# Patient Record
Sex: Male | Born: 1985 | Race: White | Hispanic: No | Marital: Single | Smoking: Current every day smoker
Health system: Southern US, Community
[De-identification: ages and names within clinical notes are randomized; demographics above are authoritative.]

## PROBLEM LIST (undated history)

## (undated) DIAGNOSIS — F419 Anxiety disorder, unspecified: Secondary | ICD-10-CM

## (undated) DIAGNOSIS — F32A Depression, unspecified: Secondary | ICD-10-CM

## (undated) DIAGNOSIS — F329 Major depressive disorder, single episode, unspecified: Secondary | ICD-10-CM

## (undated) HISTORY — PX: HERNIA REPAIR: SHX51

---

## 2014-03-05 ENCOUNTER — Emergency Department (INDEPENDENT_AMBULATORY_CARE_PROVIDER_SITE_OTHER)
Admission: EM | Admit: 2014-03-05 | Discharge: 2014-03-05 | Disposition: A | Discharge status: Home / Self Care | Payer: BC Managed Care – PPO | Source: Home / Self Care | Attending: Family Medicine | Admitting: Family Medicine

## 2014-03-05 ENCOUNTER — Encounter (HOSPITAL_COMMUNITY): Payer: Self-pay | Admitting: Emergency Medicine

## 2014-03-05 DIAGNOSIS — G44209 Tension-type headache, unspecified, not intractable: Secondary | ICD-10-CM

## 2014-03-05 HISTORY — DX: Major depressive disorder, single episode, unspecified: F32.9

## 2014-03-05 HISTORY — DX: Anxiety disorder, unspecified: F41.9

## 2014-03-05 HISTORY — DX: Depression, unspecified: F32.A

## 2014-03-05 MED ORDER — DICLOFENAC POTASSIUM(MIGRAINE) 50 MG PO PACK: 50.0000 mg | PACK | Freq: Two times a day (BID) | ORAL | Status: DC | PRN

## 2014-03-05 NOTE — ED Notes (Signed)
C/o much stress lately, headache, unable to get in to see a psychiatrist locally since moving down here for temp job 6 months ago

## 2014-03-05 NOTE — ED Provider Notes (Signed)
CSN: [id number]     Arrival date & time 03/05/14  1750 History   First MD Initiated Contact with Patient 03/05/14 1839     Chief Complaint  Patient presents with  . Headache   (Consider location/radiation/quality/duration/timing/severity/associated sxs/prior Treatment) HPI Comments: Patient presents with several week history of intermittent headaches he feels are triggered by stress. Has long standing issues with depression, insomnia and anxiety and has had initial visit with local psychiatrist (Dr. Archer AsaGerald Plovsky) [place]. Contacted Dr. Caprice RenshawPlovsky's office when he felt his stress levels increase, but was unable to find available appointment before March 23, 2014.  States headaches are becoming "a distraction" and are preventing his from doing some of the things he typically enjoys such as exercising at the gym or concentrating on his duties at work. 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 States he often becomes frustrated or angry at work and this will precipitate a headache.  Denies any additional symptoms associated with headache such as fever, recent head trauma, changes in speech, vision or balance. Denies nausea, vomiting. Reports he has been taking occasional OTC Advil with little relief. Denies drug or alcohol use. Denies excessive caffeine use. Reports he is currently trying to quit smoking Denies feelings of severe depression or  suicidal or homicidal ideation.    The history is provided by the patient.    Past Medical History  Diagnosis Date  . Anxiety   . Depression    History reviewed. No pertinent past surgical history. History reviewed. No pertinent family history. History  Substance Use Topics  . Smoking status: Current Every Day Smoker  . Smokeless tobacco: Not on file  . Alcohol Use: Yes    Review of Systems  Constitutional: Negative.   Eyes: Negative.   Respiratory: Negative.   Cardiovascular: Negative.    Gastrointestinal: Negative.   Endocrine: Negative for polydipsia, polyphagia and polyuria.  Genitourinary: Negative.   Skin: Negative.   Allergic/Immunologic: Negative for immunocompromised state.  Neurological: Positive for headaches. Negative for dizziness, tremors, seizures, syncope, facial asymmetry, speech difficulty, weakness, light-headedness and numbness.  Psychiatric/Behavioral: Positive for decreased concentration. Negative for suicidal ideas, hallucinations, behavioral problems, confusion, sleep disturbance, self-injury, dysphoric mood and agitation. The patient is nervous/anxious. The patient is not hyperactive.     Allergies  Review of patient's allergies indicates no known allergies.  Home Medications   Prior to Admission medications   Medication Sig Start Date End Date Taking? Authorizing Provider  carbamazepine (TEGRETOL) 200 MG tablet Take 200 mg by mouth 2 (two) times daily.   Yes Historical Provider, MD  lithium 300 MG tablet Take 300 mg by mouth 3 (three) times daily.   Yes Historical Provider, MD  LORazepam (ATIVAN) 1 MG tablet Take 1 mg by mouth every 8 (eight) hours.   Yes Historical Provider, MD  zolpidem (AMBIEN) 10 MG tablet Take 10 mg by mouth at bedtime as needed for sleep.   Yes Historical Provider, MD  Diclofenac Potassium 50 MG PACK Take 50 mg by mouth 2 (two) times daily as needed. Take at onset of headache. No more than 2 doses (100mg ) in 24 hours. 03/05/14   Jess BartersJennifer Lee Presson, PA   BP 137/78  Pulse 70  Temp(Src) 98.4 F (36.9 C) (Oral)  Resp 12  SpO2 98% Physical Exam  Nursing note and vitals reviewed. Constitutional: He is oriented to person, place, and time. Vital signs are normal. He appears well-developed and well-nourished. He is active  and cooperative.  Non-toxic appearance. He does not have a sickly appearance. He does not appear ill. No distress.  HENT:  Head: Normocephalic and atraumatic.  Right Ear: Hearing, tympanic membrane, external  ear and ear canal normal. No mastoid tenderness.  Left Ear: Hearing, tympanic membrane, external ear and ear canal normal. No mastoid tenderness.  Nose: Nose normal.  Mouth/Throat: Uvula is midline, oropharynx is clear and moist and mucous membranes are normal. No oral lesions. No trismus in the jaw. Normal dentition. No uvula swelling.  Eyes: Conjunctivae, EOM and lids are normal. Pupils are equal, round, and reactive to light.  Fundoscopic exam:      The right eye shows no papilledema.       The left eye shows no papilledema.  Neck: Trachea normal, normal range of motion, full passive range of motion without pain and phonation normal. Neck supple.  Cardiovascular: Normal rate, regular rhythm and normal heart sounds.   Pulmonary/Chest: Effort normal and breath sounds normal.  Abdominal: Soft. Normal appearance and bowel sounds are normal. There is no tenderness.  Musculoskeletal: Normal range of motion. He exhibits no edema and no tenderness.  Lymphadenopathy:    He has no cervical adenopathy.  Neurological: He is alert and oriented to person, place, and time. He has normal strength. No cranial nerve deficit or sensory deficit. Coordination and gait normal. GCS eye subscore is 4. GCS verbal subscore is 5. GCS motor subscore is 6.  Skin: Skin is warm and dry. No rash noted.  Psychiatric: He has a normal mood and affect. His speech is normal and behavior is normal. Judgment and thought content normal. Thought content is not paranoid. Cognition and memory are normal. He expresses no homicidal and no suicidal ideation. He expresses no suicidal plans and no homicidal plans.    ED Course  Procedures (including critical care time) Labs Review Labs Reviewed - No data to display  Imaging Review No results found.   MDM   1. Tension headache    Exam without focal finding to suggest intracranial abnormalities as cause for headaches. I asked patient specifically if he felt he needed to speak to  counselor or psychiatrist tonight and he stated he felt that he did not. He states that he feels that if he had something to help control headaches when they occur that he would be much more comfortable, less distracted and could return to activities he enjoys and would follow up with Dr. Donell BeersPlovsky in July. I informed him that is at any point he felt he needed immediate behavioral health assistance, he should report to his nearest ER. Will provide Rx for Cambia for headache treatment and advise follow up if no improvement.     Jess BartersJennifer Lee Nellis AF[place]resson, [place] 03/05/14 2120

## 2014-03-05 NOTE — Discharge Instructions (Signed)

## 2014-03-09 NOTE — ED Provider Notes (Signed)
Medical screening examination/treatment/procedure(s) were performed by a resident physician or non-physician practitioner and as the supervising physician I was immediately available for consultation/collaboration.  Evan Corey, MD    Evan S Corey, MD 03/09/14 0732 

## 2014-04-20 ENCOUNTER — Ambulatory Visit: Payer: Self-pay | Admitting: Medical

## 2014-05-03 ENCOUNTER — Other Ambulatory Visit (HOSPITAL_COMMUNITY): Payer: Self-pay | Admitting: Psychiatry

## 2014-05-07 ENCOUNTER — Other Ambulatory Visit (HOSPITAL_COMMUNITY): Payer: Self-pay | Admitting: Psychiatry

## 2014-07-18 ENCOUNTER — Telehealth: Payer: Self-pay

## 2014-07-18 ENCOUNTER — Ambulatory Visit (INDEPENDENT_AMBULATORY_CARE_PROVIDER_SITE_OTHER): Payer: BC Managed Care – PPO

## 2014-07-18 ENCOUNTER — Ambulatory Visit (INDEPENDENT_AMBULATORY_CARE_PROVIDER_SITE_OTHER): Payer: BC Managed Care – PPO | Admitting: Family Medicine

## 2014-07-18 ENCOUNTER — Ambulatory Visit (HOSPITAL_BASED_OUTPATIENT_CLINIC_OR_DEPARTMENT_OTHER)
Admission: RE | Admit: 2014-07-18 | Discharge: 2014-07-18 | Disposition: A | Discharge status: Home / Self Care | Payer: BC Managed Care – PPO | Source: Ambulatory Visit | Attending: Family Medicine | Admitting: Family Medicine

## 2014-07-18 ENCOUNTER — Encounter: Payer: Self-pay | Admitting: Family Medicine

## 2014-07-18 VITALS — BP 105/68 | HR 73 | Temp 98.6°F | Resp 12 | Ht 72.0 in | Wt 191.4 lb

## 2014-07-18 DIAGNOSIS — R410 Disorientation, unspecified: Secondary | ICD-10-CM

## 2014-07-18 DIAGNOSIS — S060X1A Concussion with loss of consciousness of 30 minutes or less, initial encounter: Secondary | ICD-10-CM

## 2014-07-18 DIAGNOSIS — R55 Syncope and collapse: Secondary | ICD-10-CM | POA: Insufficient documentation

## 2014-07-18 DIAGNOSIS — W19XXXA Unspecified fall, initial encounter: Secondary | ICD-10-CM | POA: Diagnosis not present

## 2014-07-18 DIAGNOSIS — R072 Precordial pain: Secondary | ICD-10-CM

## 2014-07-18 DIAGNOSIS — R42 Dizziness and giddiness: Secondary | ICD-10-CM | POA: Diagnosis present

## 2014-07-18 DIAGNOSIS — F05 Delirium due to known physiological condition: Secondary | ICD-10-CM

## 2014-07-18 DIAGNOSIS — R2689 Other abnormalities of gait and mobility: Secondary | ICD-10-CM

## 2014-07-18 LAB — POCT CBC
Granulocyte percent: 63.1 %G (ref 37–80)
HCT, POC: 43.3 % — AB (ref 43.5–53.7)
HEMOGLOBIN: 14.3 g/dL (ref 14.1–18.1)
Lymph, poc: 2.7 (ref 0.6–3.4)
MCH: 31.1 pg (ref 27–31.2)
MCHC: 33 g/dL (ref 31.8–35.4)
MCV: 94.3 fL (ref 80–97)
MID (cbc): 0.7 (ref 0–0.9)
MPV: 8.7 fL (ref 0–99.8)
POC Granulocyte: 5.7 (ref 2–6.9)
POC LYMPH PERCENT: 29.5 %L (ref 10–50)
POC MID %: 7.4 %M (ref 0–12)
Platelet Count, POC: 193 10*3/uL (ref 142–424)
RBC: 4.59 M/uL — AB (ref 4.69–6.13)
RDW, POC: 13 %
WBC: 9 10*3/uL (ref 4.6–10.2)

## 2014-07-18 LAB — COMPREHENSIVE METABOLIC PANEL
ALBUMIN: 4.3 g/dL (ref 3.5–5.2)
ALK PHOS: 123 U/L — AB (ref 39–117)
ALT: 24 U/L (ref 0–53)
AST: 19 U/L (ref 0–37)
BUN: 13 mg/dL (ref 6–23)
CHLORIDE: 105 meq/L (ref 96–112)
CO2: 27 meq/L (ref 19–32)
Calcium: 9.5 mg/dL (ref 8.4–10.5)
Creat: 0.82 mg/dL (ref 0.50–1.35)
GLUCOSE: 61 mg/dL — AB (ref 70–99)
Potassium: 4.5 mEq/L (ref 3.5–5.3)
Sodium: 139 mEq/L (ref 135–145)
TOTAL PROTEIN: 7.1 g/dL (ref 6.0–8.3)
Total Bilirubin: 0.4 mg/dL (ref 0.2–1.2)

## 2014-07-18 LAB — TSH: TSH: 0.924 u[IU]/mL (ref 0.350–4.500)

## 2014-07-18 LAB — POCT SEDIMENTATION RATE: POCT SED RATE: 2 mm/hr (ref 0–22)

## 2014-07-18 LAB — GLUCOSE, POCT (MANUAL RESULT ENTRY): POC Glucose: 59 mg/dl — AB (ref 70–99)

## 2014-07-18 NOTE — Telephone Encounter (Signed)
Lmom with normal CT head results.

## 2014-07-18 NOTE — Patient Instructions (Addendum)
Go to [facility] register at the Emergency Department for OUTPATIENT CT. DO NOT register as ED patient.   You need to have bed rest and brain rest until you are feeling back to normal.  You can listen to music, sleep, and read large print non-stressful things NOT on a screen for the next day.  When you are feeling better - hopefully by tomorrow - you can gradually increase your activity. You can take your normal medications but do not take any additional and try to stay away from any over the counter medications. When you are mentally feeling back to normal without a headache, you can resume reading and going to work. Try to stay away from screens in your spare time for that day. If you do well, with no recurrence of symptoms, you can resume phone/computer/tv in your spare time.  If you are tolerating that for a day, then you can resume working out.  If you ever have symptoms recur, resume the step prior where you were asymptomatic and stay at that level for 24 hours before trying to progress activity again.   Head Injury You have received a head injury. It does not appear serious at this time. Headaches and vomiting are common following head injury. It should be easy to awaken from sleeping. Sometimes it is necessary for you to stay in the emergency department for a while for observation. Sometimes admission to the hospital may be needed. After injuries such as yours, most problems occur within the first 24 hours, but side effects may occur up to 7-10 days after the injury. It is important for you to carefully monitor your condition and contact your health care provider or seek immediate medical care if there is a change in your condition. WHAT ARE THE TYPES OF HEAD INJURIES? Head injuries can be as minor as a bump. Some head injuries can be more severe. More severe head injuries include:  A jarring injury to the brain (concussion).  A bruise of the brain (contusion). This mean there is  bleeding in the brain that can cause swelling.  A cracked skull (skull fracture).  Bleeding in the brain that collects, clots, and forms a bump (hematoma). WHAT CAUSES A HEAD INJURY? A serious head injury is most likely to happen to someone who is in a car wreck and is not wearing a seat belt. Other causes of major head injuries include bicycle or motorcycle accidents, sports injuries, and falls. HOW ARE HEAD INJURIES DIAGNOSED? A complete history of the event leading to the injury and your current symptoms will be helpful in diagnosing head injuries. Many times, pictures of the brain, such as CT or MRI are needed to see the extent of the injury. Often, an overnight hospital stay is necessary for observation.  WHEN SHOULD I SEEK IMMEDIATE MEDICAL CARE?  You should get help right away if:  You have confusion or drowsiness.  You feel sick to your stomach (nauseous) or have continued, forceful vomiting.  You have dizziness or unsteadiness that is getting worse.  You have severe, continued headaches not relieved by medicine. Only take over-the-counter or prescription medicines for pain, fever, or discomfort as directed by your health care provider.  You do not have normal function of the arms or legs or are unable to walk.  You notice changes in the black spots in the center of the colored part of your eye (pupil).  You have a clear or bloody fluid coming from your nose  or ears.  You have a loss of vision. During the next 24 hours after the injury, you must stay with someone who can watch you for the warning signs. This person should contact local emergency services (911 in the [place] if you have seizures, you become unconscious, or you are unable to wake up. HOW CAN I PREVENT A HEAD INJURY IN THE FUTURE? The most important factor for preventing major head injuries is avoiding motor vehicle accidents. To minimize the potential for damage to your head, it is crucial to wear seat belts while  riding in motor vehicles. Wearing helmets while bike riding and playing collision sports (like football) is also helpful. Also, avoiding dangerous activities around the house will further help reduce your risk of head injury.  WHEN CAN I RETURN TO NORMAL ACTIVITIES AND ATHLETICS? You should be reevaluated by your health care provider before returning to these activities. If you have any of the following symptoms, you should not return to activities or contact sports until 1 week after the symptoms have stopped:  Persistent headache.  Dizziness or vertigo.  Poor attention and concentration.  Confusion.  Memory problems.  Nausea or vomiting.  Fatigue or tire easily.  Irritability.  Intolerant of bright lights or loud noises.  Anxiety or depression.  Disturbed sleep. MAKE SURE YOU:   Understand these instructions.  Will watch your condition.  Will get help right away if you are not doing well or get worse. Document Released: 08/27/2005 Document Revised: 09/01/2013 Document Reviewed: 05/04/2013 [facility] Patient Information 2015 [place], [place]. This information is not intended to replace advice given to you by your health care provider. Make sure you discuss any questions you have with your health care provider.  Concussion Direct trauma to the head often causes a condition known as a concussion. This injury can temporarily interfere with brain function and may cause you to pass out (lose consciousness). The consequences of a concussion are usually short-term, but repetitive concussions can be very dangerous. If you have multiple concussions, you will have a greater risk of long-term effects, such as slurred speech, slow movements, impaired thinking, or tremors. The severity of a concussion is based on the length and severity of the interference with brain activity. SYMPTOMS  Symptoms of a concussion vary depending on the severity of the injury. Very mild concussions may even occur  without any noticeable symptoms. Swelling in the area of the injury is not related to the seriousness of the injury.   Mild concussion:  Temporary loss of consciousness may or may not occur.  Memory loss (amnesia) for a short time.  Emotional instability.  Confusion.  Severe concussion:  Usually prolonged loss of consciousness.  Confusion  One pupil (the black part in the middle of the eye) is larger than the other.  Changes in vision (including blurring).  Changes in breathing.  Disturbed balance (equilibrium).  Headaches.  Confusion.  Nausea or vomiting.  Slower reaction time than normal.  Difficulty learning and remembering things you have heard. CAUSES  A concussion is the result of trauma to the head. When the head is subjected to such an injury, the brain strikes against the inner wall of the skull. This impact is what causes the damage to the brain. The force of injury is related to severity of injury. The most severe concussions are associated with incidents that involve large impact forces such as motor vehicle accidents. Wearing a helmet will reduce the severity of trauma to the head, but concussions  may still occur if you are wearing a helmet. RISK INCREASES WITH:  Contact sports (football, hockey, soccer, rugby, basketball or lacrosse).  Fighting sports (martial arts or boxing).  Riding bicycles, motorcycles, or horses (when you ride without a helmet). PREVENTION  Wear proper protective headgear and ensure correct fit.  Wear seat belts when driving and riding in a car.  Do not drink or use mind-altering drugs and drive. PROGNOSIS  Concussions are typically curable if they are recognized and treated early. If a severe concussion or multiple concussions go untreated, then the complications may be life-threatening or cause permanent disability and brain damage. RELATED COMPLICATIONS   Permanent brain damage (slurred speech, slow movement, impaired  thinking, or tremors).  Bleeding under the skull (subdural hemorrhage or hematoma, epidural hematoma).  Bleeding into the brain.  Prolonged healing time if usual activities are resumed too soon.  Infection if skin over the concussion site is broken.  Increased risk of future concussions (less trauma is required for a second concussion than the first). TREATMENT  Treatment initially requires immediate evaluation to determine the severity of the concussion. Occasionally, a hospital stay may be required for observation and treatment.  Avoid exertion. Bed rest for the first 24-48 hours is recommended.  Return to play is a controversial subject due to the increased risk for future injury as well as permanent disability and should be discussed at length with your treating caregiver. Many factors such as the severity of the concussion and whether this is the first, second, or third concussion play a role in timing a patient's return to sports.  MEDICATION  Do not give any medicine, including non-prescription acetaminophen or aspirin, until the diagnosis is certain. These medicines may mask developing symptoms.  SEEK IMMEDIATE MEDICAL CARE IF:   Symptoms get worse or do not improve in 24 hours.  Any of the following symptoms occur:  Vomiting.  The inability to move arms and legs equally well on both sides.  Fever.  Neck stiffness.  Pupils of unequal size, shape, or reactivity.  Convulsions.  Noticeable restlessness.  Severe headache that persists for longer than 4 hours after injury.  Confusion, disorientation, or mental status changes. Document Released: 08/27/2005 Document Revised: 06/17/2013 Document Reviewed: 12/09/2008 [facility] Patient Information 2015 [place], [place]. This information is not intended to replace advice given to you by your health care provider. Make sure you discuss any questions you have with your health care provider.  Concussion A concussion, or  closed-head injury, is a brain injury caused by a direct blow to the head or by a quick and sudden movement (jolt) of the head or neck. Concussions are usually not life-threatening. Even so, the effects of a concussion can be serious. If you have had a concussion before, you are more likely to experience concussion-like symptoms after a direct blow to the head.  CAUSES  Direct blow to the head, such as from running into another player during a soccer game, being hit in a fight, or hitting your head on a hard surface.  A jolt of the head or neck that causes the brain to move back and forth inside the skull, such as in a car crash. SIGNS AND SYMPTOMS The signs of a concussion can be hard to notice. Early on, they may be missed by you, family members, and health care providers. You may look fine but act or feel differently. Symptoms are usually temporary, but they may last for days, weeks, or even longer. Some symptoms may  appear right away while others may not show up for hours or days. Every head injury is different. Symptoms include:  Mild to moderate headaches that will not go away.  A feeling of pressure inside your head.  Having more trouble than usual:  Learning or remembering things you have heard.  Answering questions.  Paying attention or concentrating.  Organizing daily tasks.  Making decisions and solving problems.  Slowness in thinking, acting or reacting, speaking, or reading.  Getting lost or being easily confused.  Feeling tired all the time or lacking energy (fatigued).  Feeling drowsy.  Sleep disturbances.  Sleeping more than usual.  Sleeping less than usual.  Trouble falling asleep.  Trouble sleeping (insomnia).  Loss of balance or feeling lightheaded or dizzy.  Nausea or vomiting.  Numbness or tingling.  Increased sensitivity to:  Sounds.  Lights.  Distractions.  Vision problems or eyes that tire easily.  Diminished sense of taste or  smell.  Ringing in the ears.  Mood changes such as feeling sad or anxious.  Becoming easily irritated or angry for little or no reason.  Lack of motivation.  Seeing or hearing things other people do not see or hear (hallucinations). DIAGNOSIS Your health care provider can usually diagnose a concussion based on a description of your injury and symptoms. He or she will ask whether you passed out (lost consciousness) and whether you are having trouble remembering events that happened right before and during your injury. Your evaluation might include:  A brain scan to look for signs of injury to the brain. Even if the test shows no injury, you may still have a concussion.  Blood tests to be sure other problems are not present. TREATMENT  Concussions are usually treated in an emergency department, in urgent care, or at a clinic. You may need to stay in the hospital overnight for further treatment.  Tell your health care provider if you are taking any medicines, including prescription medicines, over-the-counter medicines, and natural remedies. Some medicines, such as blood thinners (anticoagulants) and aspirin, may increase the chance of complications. Also tell your health care provider whether you have had alcohol or are taking illegal drugs. This information may affect treatment.  Your health care provider will send you home with important instructions to follow.  How fast you will recover from a concussion depends on many factors. These factors include how severe your concussion is, what part of your brain was injured, your age, and how healthy you were before the concussion.  Most people with mild injuries recover fully. Recovery can take time. In general, recovery is slower in older persons. Also, persons who have had a concussion in the past or have other medical problems may find that it takes longer to recover from their current injury. HOME CARE INSTRUCTIONS General  Instructions  Carefully follow the directions your health care provider gave you.  Only take over-the-counter or prescription medicines for pain, discomfort, or fever as directed by your health care provider.  Take only those medicines that your health care provider has approved.  Do not drink alcohol until your health care provider says you are well enough to do so. Alcohol and certain other drugs may slow your recovery and can put you at risk of further injury.  If it is harder than usual to remember things, write them down.  If you are easily distracted, try to do one thing at a time. For example, do not try to watch TV while fixing dinner.  Talk with family members or close friends when making important decisions.  Keep all follow-up appointments. Repeated evaluation of your symptoms is recommended for your recovery.  Watch your symptoms and tell others to do the same. Complications sometimes occur after a concussion. Older adults with a brain injury may have a higher risk of serious complications, such as a blood clot on the brain.  Tell your teachers, school nurse, school counselor, coach, athletic trainer, or work Production designer, theatre/television/film about your injury, symptoms, and restrictions. Tell them about what you can or cannot do. They should watch for:  Increased problems with attention or concentration.  Increased difficulty remembering or learning new information.  Increased time needed to complete tasks or assignments.  Increased irritability or decreased ability to cope with stress.  Increased symptoms.  Rest. Rest helps the brain to heal. Make sure you:  Get plenty of sleep at night. Avoid staying up late at night.  Keep the same bedtime hours on weekends and weekdays.  Rest during the day. Take daytime naps or rest breaks when you feel tired.  Limit activities that require a lot of thought or concentration. These include:  Doing homework or job-related work.  Watching  TV.  Working on the computer.  Avoid any situation where there is potential for another head injury (football, hockey, soccer, basketball, martial arts, downhill snow sports and horseback riding). Your condition will get worse every time you experience a concussion. You should avoid these activities until you are evaluated by the appropriate follow-up health care providers. Returning To Your Regular Activities You will need to return to your normal activities slowly, not all at once. You must give your body and brain enough time for recovery.  Do not return to sports or other athletic activities until your health care provider tells you it is safe to do so.  Ask your health care provider when you can drive, ride a bicycle, or operate heavy machinery. Your ability to react may be slower after a brain injury. Never do these activities if you are dizzy.  Ask your health care provider about when you can return to work or school. Preventing Another Concussion It is very important to avoid another brain injury, especially before you have recovered. In rare cases, another injury can lead to permanent brain damage, brain swelling, or death. The risk of this is greatest during the first 7-10 days after a head injury. Avoid injuries by:  Wearing a seat belt when riding in a car.  Drinking alcohol only in moderation.  Wearing a helmet when biking, skiing, skateboarding, skating, or doing similar activities.  Avoiding activities that could lead to a second concussion, such as contact or recreational sports, until your health care provider says it is okay.  Taking safety measures in your home.  Remove clutter and tripping hazards from floors and stairways.  Use grab bars in bathrooms and handrails by stairs.  Place non-slip mats on floors and in bathtubs.  Improve lighting in dim areas. SEEK MEDICAL CARE IF:  You have increased problems paying attention or concentrating.  You have increased  difficulty remembering or learning new information.  You need more time to complete tasks or assignments than before.  You have increased irritability or decreased ability to cope with stress.  You have more symptoms than before. Seek medical care if you have any of the following symptoms for more than 2 weeks after your injury:  Lasting (chronic) headaches.  Dizziness or balance problems.  Nausea.  Vision  problems.  Increased sensitivity to noise or light.  Depression or mood swings.  Anxiety or irritability.  Memory problems.  Difficulty concentrating or paying attention.  Sleep problems.  Feeling tired all the time. SEEK IMMEDIATE MEDICAL CARE IF:  You have severe or worsening headaches. These may be a sign of a blood clot in the brain.  You have weakness (even if only in one hand, leg, or part of the face).  You have numbness.  You have decreased coordination.  You vomit repeatedly.  You have increased sleepiness.  One pupil is larger than the other.  You have convulsions.  You have slurred speech.  You have increased confusion. This may be a sign of a blood clot in the brain.  You have increased restlessness, agitation, or irritability.  You are unable to recognize people or places.  You have neck pain.  It is difficult to wake you up.  You have unusual behavior changes.  You lose consciousness. MAKE SURE YOU:  Understand these instructions.  Will watch your condition.  Will get help right away if you are not doing well or get worse. Document Released: 11/17/2003 Document Revised: 09/01/2013 Document Reviewed: 03/19/2013 [facility] Patient Information 2015 [place], [place]. This information is not intended to replace advice given to you by your health care provider. Make sure you discuss any questions you have with your health care provider.   Post-Concussion Syndrome Post-concussion syndrome describes the symptoms that can occur after a  head injury. These symptoms can last from weeks to months. CAUSES  It is not clear why some head injuries cause post-concussion syndrome. It can occur whether your head injury was mild or severe and whether you were wearing head protection or not.  SIGNS AND SYMPTOMS  Memory difficulties.  Dizziness.  Headaches.  Double vision or blurry vision.  Sensitivity to light.  Hearing difficulties.  Depression.  Tiredness.  Weakness.  Difficulty with concentration.  Difficulty sleeping or staying asleep.  Vomiting.  Poor balance or instability on your feet.  Slow reaction time.  Difficulty learning and remembering things you have heard. DIAGNOSIS  There is no test to determine whether you have post-concussion syndrome. Your health care provider may order an imaging scan of your brain, such as a CT scan, to check for other problems that may be causing your symptoms (such as severe injury inside your skull). TREATMENT  Usually, these problems disappear over time without medical care. Your health care provider may prescribe medicine to help ease your symptoms. It is important to follow up with a neurologist to evaluate your recovery and address any lingering symptoms or issues. HOME CARE INSTRUCTIONS   Only take over-the-counter or prescription medicines for pain, discomfort, or fever as directed by your health care provider. Do not take aspirin. Aspirin can slow blood clotting.  Sleep with your head slightly elevated to help with headaches.  Avoid any situation where there is potential for another head injury (football, hockey, soccer, basketball, martial arts, downhill snow sports, and horseback riding). Your condition will get worse every time you experience a concussion. You should avoid these activities until you are evaluated by the appropriate follow-up health care providers.  Keep all follow-up appointments as directed by your health care provider. SEEK IMMEDIATE MEDICAL  CARE IF:  You develop confusion or unusual drowsiness.  You cannot wake the injured person.  You develop nausea or persistent, forceful vomiting.  You feel like you are moving when you are not (vertigo).  You notice the  injured person's eyes moving rapidly back and forth. This may be a sign of vertigo.  You have convulsions or faint.  You have severe, persistent headaches that are not relieved by medicine.  You cannot use your arms or legs normally.  Your pupils change size.  You have clear or bloody discharge from the nose or ears.  Your problems are getting worse, not better. MAKE SURE YOU:  Understand these instructions.  Will watch your condition.  Will get help right away if you are not doing well or get worse. Document Released: 02/16/2002 Document Revised: 06/17/2013 Document Reviewed: 12/02/2013 [facility] Patient Information 2015 [place], [place]. This information is not intended to replace advice given to you by your health care provider. Make sure you discuss any questions you have with your health care provider.

## 2014-07-18 NOTE — Progress Notes (Addendum)
Subjective:  This chart was scribed for Norberto SorensonEva Kadar Chance, MD by Haywood PaoNadim Abu Hashem, ED Scribe at [facility].The patient was seen in exam room 03 and the patient's care was started at 1:52 PM.   Patient ID: Andre Barton, male    DOB: 1986-09-08, 28 y.o.   MRN: [id number]  Chief Complaint  Patient presents with  . Loss of Consciousness    Saturday morning-Back & Rib Pain  ?Hit Head   HPI  HPI Comments: Andre Barton is a 28 y.o. male who [facility] complaining of LOC, rib pain which radiates to his back. On Friday morning he got up to use the bathroom and after he used the bathroom he got up and fell and isn't sure what he hit. Pt does think he "blacked out". He has felt fatigued, he notes napping 4 times yesterday which is very unlike him, he has loss of coordination and trouble concentrating as associated symptoms. This event has happened prev 3 times during stressful events starting with applying to college. Pt has not seen a neurologist. He denies GI, GU, or vision sxs. He says he felt warm at times depending on what he was wearing or the environment.  Regarding his rib and back pain, he says he has pain with  deep breathing, and pain when laying down as associated symptoms. He took an Advil, which provided no relief. He also used a icy hot patch which did help.    He says he has felt light headed the past few weeks. Also notes being very stressed and fatigued due to his job.  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  Pt has had trouble sleeping and has usually takes Lorazepam and it has been "hit or miss" so his doctor has increased his ativan dose from 1 mg to 2 mg.  Last saw Dr. Donell BeersPlovsky the second week of October. He has gotten blood work in September.  He takes  meds for bipolar but is unsure about this diagnosis - was made about 10 yrs ago when he was in college and under a lot of stress in addition to some substance abuse and family problems. Now hoping his mood sxs might have been due to the stress but recently still under stress including a DUI earlier this year.  He notes the past week he has been very hungry at times and no appetite at times.   Past Medical History  Diagnosis Date  . Anxiety   . Depression    Current Outpatient Prescriptions on File Prior to Visit  Medication Sig Dispense Refill  . carbamazepine (TEGRETOL) 200 MG tablet Take 200 mg by mouth 2 (two) times daily.    Marland Kitchen. lithium 300 MG tablet Take 300 mg by mouth 3 (three) times daily.    Marland Kitchen. LORazepam (ATIVAN) 1 MG tablet Take 1 mg by mouth every 8 (eight) hours.    Marland Kitchen. zolpidem (AMBIEN) 10 MG tablet Take 10 mg by mouth at bedtime as needed for sleep.     No current facility-administered medications on file prior to visit.  No Known Allergies   Review of Systems  Constitutional: Positive for fatigue.  Genitourinary: Positive for flank pain.  Musculoskeletal: Positive for back pain.  Neurological: Positive for syncope  and light-headedness.  Psychiatric/Behavioral: Positive for decreased concentration.       Objective:  BP 105/68 mmHg  Pulse 73  Temp(Src) 98.6 F (37 C) (Oral)  Resp 12  Ht 6' (1.829 m)  Wt 191 lb 6 oz (86.807 kg)  BMI 25.95 kg/m2  SpO2 98%  Physical Exam  Constitutional: He is oriented to person, place, and time. He appears well-developed and well-nourished. No distress.  HENT:  Head: Normocephalic and atraumatic.  Right Ear: Tympanic membrane, external ear and ear canal normal.  Left Ear: External ear and ear canal normal. Tympanic membrane is erythematous and retracted.  Nose: Rhinorrhea present.  Mouth/Throat: Uvula is midline and mucous membranes are normal. Posterior oropharyngeal erythema present. No oropharyngeal exudate or posterior  oropharyngeal edema.  Eyes: Conjunctivae and EOM are normal. Pupils are equal, round, and reactive to light. No scleral icterus.  Neck: Normal range of motion. Neck supple. No thyromegaly present.  Cardiovascular: Normal rate, regular rhythm, S1 normal, S2 normal, normal heart sounds and intact distal pulses.   Pulmonary/Chest: Effort normal and breath sounds normal. No respiratory distress.  Abdominal: Soft. Bowel sounds are normal. He exhibits no distension and no mass. There is no tenderness. There is no rebound and no guarding.  Musculoskeletal: He exhibits no edema.  Lymphadenopathy:    He has no cervical adenopathy.    He has no axillary adenopathy.  Neurological: He is alert and oriented to person, place, and time. He has normal strength. He displays tremor. He displays normal reflexes. No cranial nerve deficit or sensory deficit. He exhibits normal muscle tone. He displays a negative Romberg sign. Coordination abnormal. Gait normal. GCS eye subscore is 4. GCS verbal subscore is 5. GCS motor subscore is 6.  Negative pronator drift. Tandem gait with some ataxia and unable to maintain 1 legged balance for a sig time. Abnormal/poor repetition and accuracy on finger-to- nose, normal rapid alternating movement and heel-to-shin.  Skin: Skin is warm and dry. He is not diaphoretic. No erythema.  Psychiatric: He has a normal mood and affect. His speech is normal and behavior is normal. Judgment and thought content normal. Cognition and memory are normal. Cognition and memory are not impaired. He exhibits normal recent memory and normal remote memory.   Orthostatic negative expect for large increase in HR from sitting to standing over 20 bpm. Pt asymptomatic with this.  [facility] reading (PRIMARY) by  Dr. Clelia Croft. Lt rib xrays: no acute abnormality     Results for orders placed or performed in visit on 07/18/14  Comprehensive metabolic panel  Result Value Ref Range   Sodium 139 135 - 145 mEq/L    Potassium 4.5 3.5 - 5.3 mEq/L   Chloride 105 96 - 112 mEq/L   CO2 27 19 - 32 mEq/L   Glucose, Bld 61 (L) 70 - 99 mg/dL   BUN 13 6 - 23 mg/dL   Creat 4.09 8.11 - 9.14 mg/dL   Total Bilirubin 0.4 0.2 - 1.2 mg/dL   Alkaline Phosphatase 123 (H) 39 - 117 U/L   AST 19 0 - 37 U/L   ALT 24 0 - 53 U/L   Total Protein 7.1 6.0 - 8.3 g/dL   Albumin 4.3 3.5 - 5.2 g/dL   Calcium 9.5 8.4 - 78.2 mg/dL  TSH  Result Value Ref Range   TSH 0.924 0.350 - 4.500 uIU/mL  POCT CBC  Result Value Ref Range   WBC 9.0 4.6 - 10.2 K/uL   Lymph, poc 2.7 0.6 -  3.4   POC LYMPH PERCENT 29.5 10 - 50 %L   MID (cbc) 0.7 0 - 0.9   POC MID % 7.4 0 - 12 %M   POC Granulocyte 5.7 2 - 6.9   Granulocyte percent 63.1 37 - 80 %G   RBC 4.59 (A) 4.69 - 6.13 M/uL   Hemoglobin 14.3 14.1 - 18.1 g/dL   HCT, POC 16.143.3 (A) 09.643.5 - 53.7 %   MCV 94.3 80 - 97 fL   MCH, POC 31.1 27 - 31.2 pg   MCHC 33.0 31.8 - 35.4 g/dL   RDW, POC 04.513.0 %   Platelet Count, POC 193 142 - 424 K/uL   MPV 8.7 0 - 99.8 fL  POCT glucose (manual entry)  Result Value Ref Range   POC Glucose 59 (A) 70 - 99 mg/dl  POCT SEDIMENTATION RATE  Result Value Ref Range   POCT SED RATE 2 0 - 22 mm/hr    Assessment & Plan:   Syncope, unspecified syncope type - Plan: POCT CBC, POCT glucose (manual entry), POCT SEDIMENTATION RATE, Comprehensive metabolic panel, TSH, DG Ribs Unilateral W/Chest Left, Ambulatory referral to Neurology, CT Head Wo Contrast - suspect vasovagal but will refer to neuro for further evaluation as has had 4 episodes in past 10 yrs w/o any prior eval.  Precordial pain  Imbalance - Plan: Ambulatory referral to Neurology  Subacute confusional state - maybe worsened by tegretol manufacturer change and exac by psych meds - f/u w/ Dr. Rudean HittPlovky  Concussion with loss of consciousness, 30 minutes or less, initial encounter - Plan: Ambulatory referral to Neurology, CT Head Wo Contrast - suspect concussion due to sxs - advised brain rest x 24 hrs and  then gradually increase activity if sxs resolved.  RTC if worsening. Pt reassured w/ nml stat head CT   Meds ordered this encounter  Medications  . buPROPion (WELLBUTRIN XL) 300 MG 24 hr tablet    Sig: Take 300 mg by mouth daily.  Marland Kitchen. LORazepam (ATIVAN) 0.5 MG tablet    Sig:     Refill:  4    I personally performed the services described in this documentation, which was scribed in my presence. The recorded information has been reviewed and considered, and addended by me as needed.  Norberto SorensonEva Willis Kuipers, MD MPH   CLINICAL DATA: Syncope, fall, lightheaded  EXAM: CT HEAD WITHOUT CONTRAST  TECHNIQUE: Contiguous axial images were obtained from the base of the skull through the vertex without intravenous contrast.  COMPARISON: None.  FINDINGS: No evidence of parenchymal hemorrhage or extra-axial fluid collection. No mass lesion, mass effect, or midline shift.  No CT evidence of acute infarction.  Cerebral volume is within normal limits. No ventriculomegaly.  The visualized paranasal sinuses are essentially clear. The mastoid air cells are unopacified.  No evidence of calvarial fracture.  IMPRESSION: Normal head CT.

## 2015-03-03 ENCOUNTER — Other Ambulatory Visit (HOSPITAL_COMMUNITY): Payer: Self-pay | Admitting: Psychiatry

## 2015-03-07 ENCOUNTER — Other Ambulatory Visit (HOSPITAL_COMMUNITY): Payer: Self-pay | Admitting: Psychiatry

## 2015-03-07 NOTE — Telephone Encounter (Signed)
Pt called requesting refill of medication stating he moved to [facility] and had seen Plovsky in the past. Was asking for refills until he can find a new MD. Attempted to call patient back with no answer. Refills not appropriate, no record of seeing Plovsky at [facility].

## 2015-03-17 ENCOUNTER — Other Ambulatory Visit (HOSPITAL_COMMUNITY): Payer: Self-pay | Admitting: Psychiatry

## 2016-01-25 IMAGING — CR DG RIBS W/ CHEST 3+V*L*
3 series · 3 of 3 positions shown · non-contrast
Comparison: None

CLINICAL DATA: 28-year-old male with left-sided rib pain after
falling in his bathroom at home 2 days ago

EXAM:
LEFT RIBS AND CHEST - 3+ VIEW

[PA]
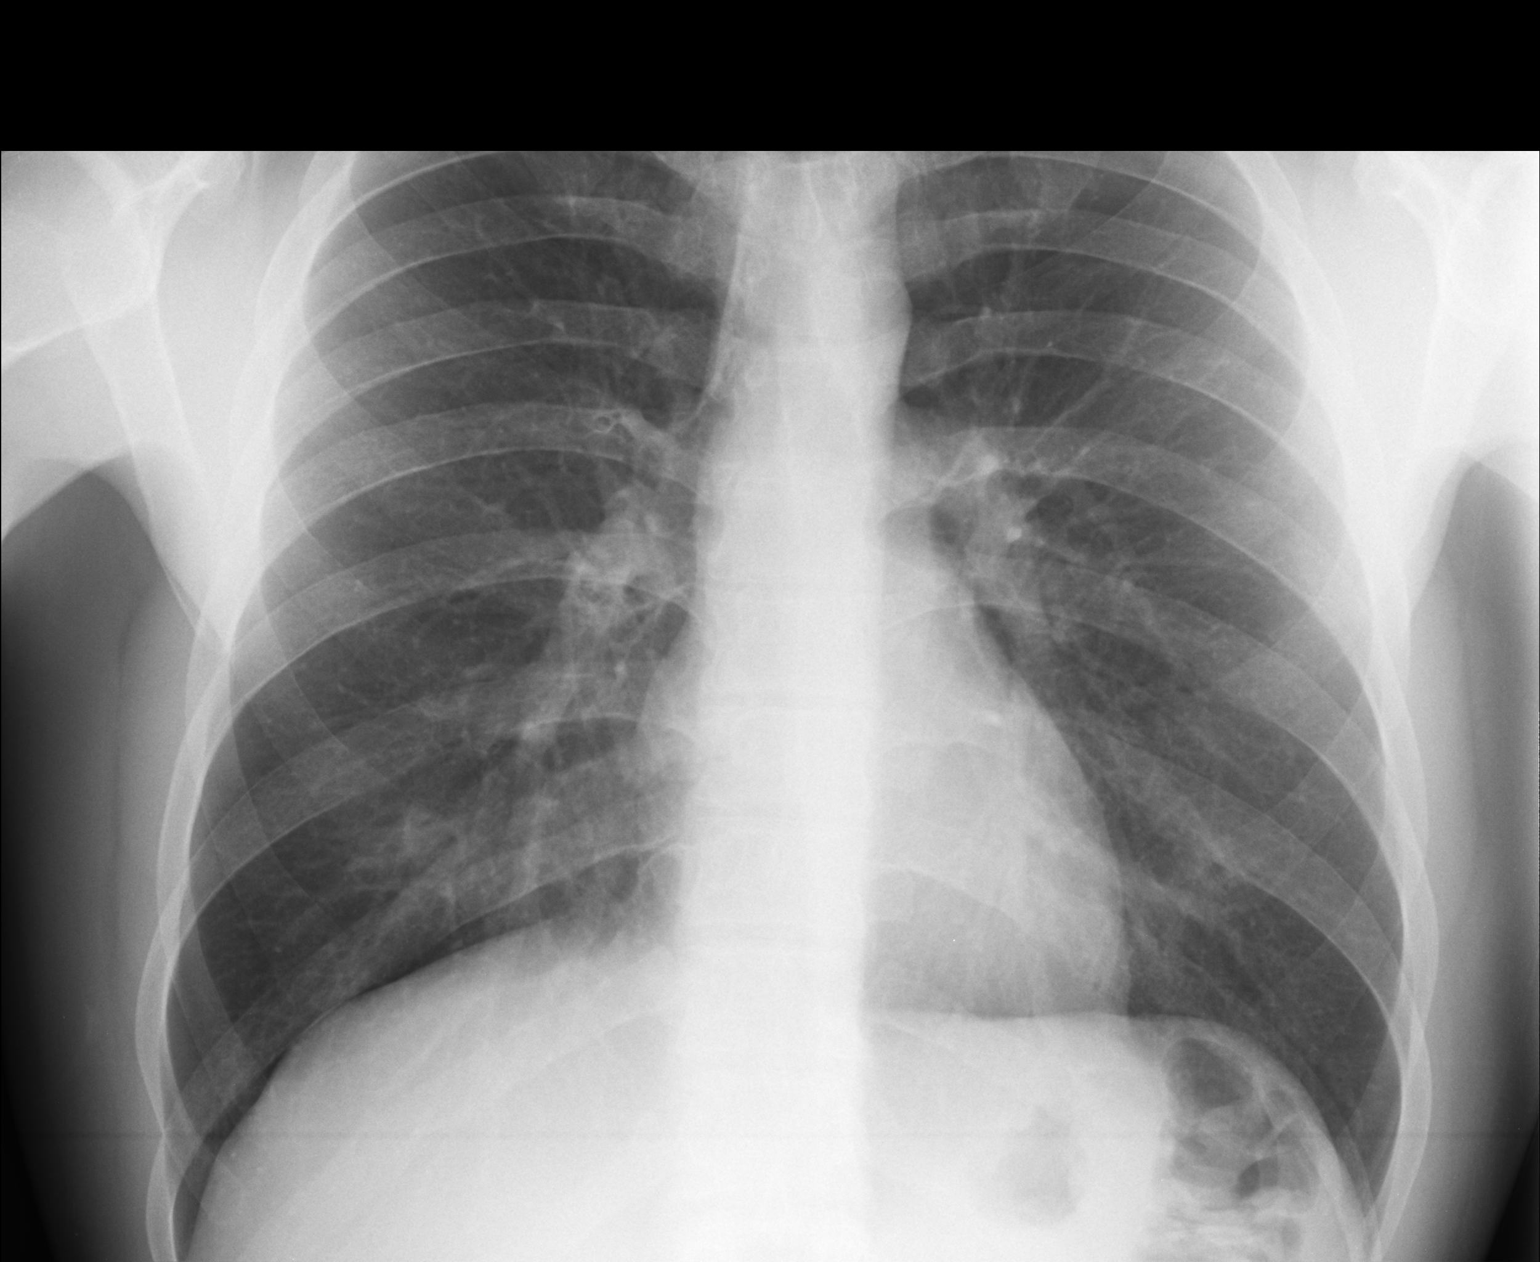

[rao]
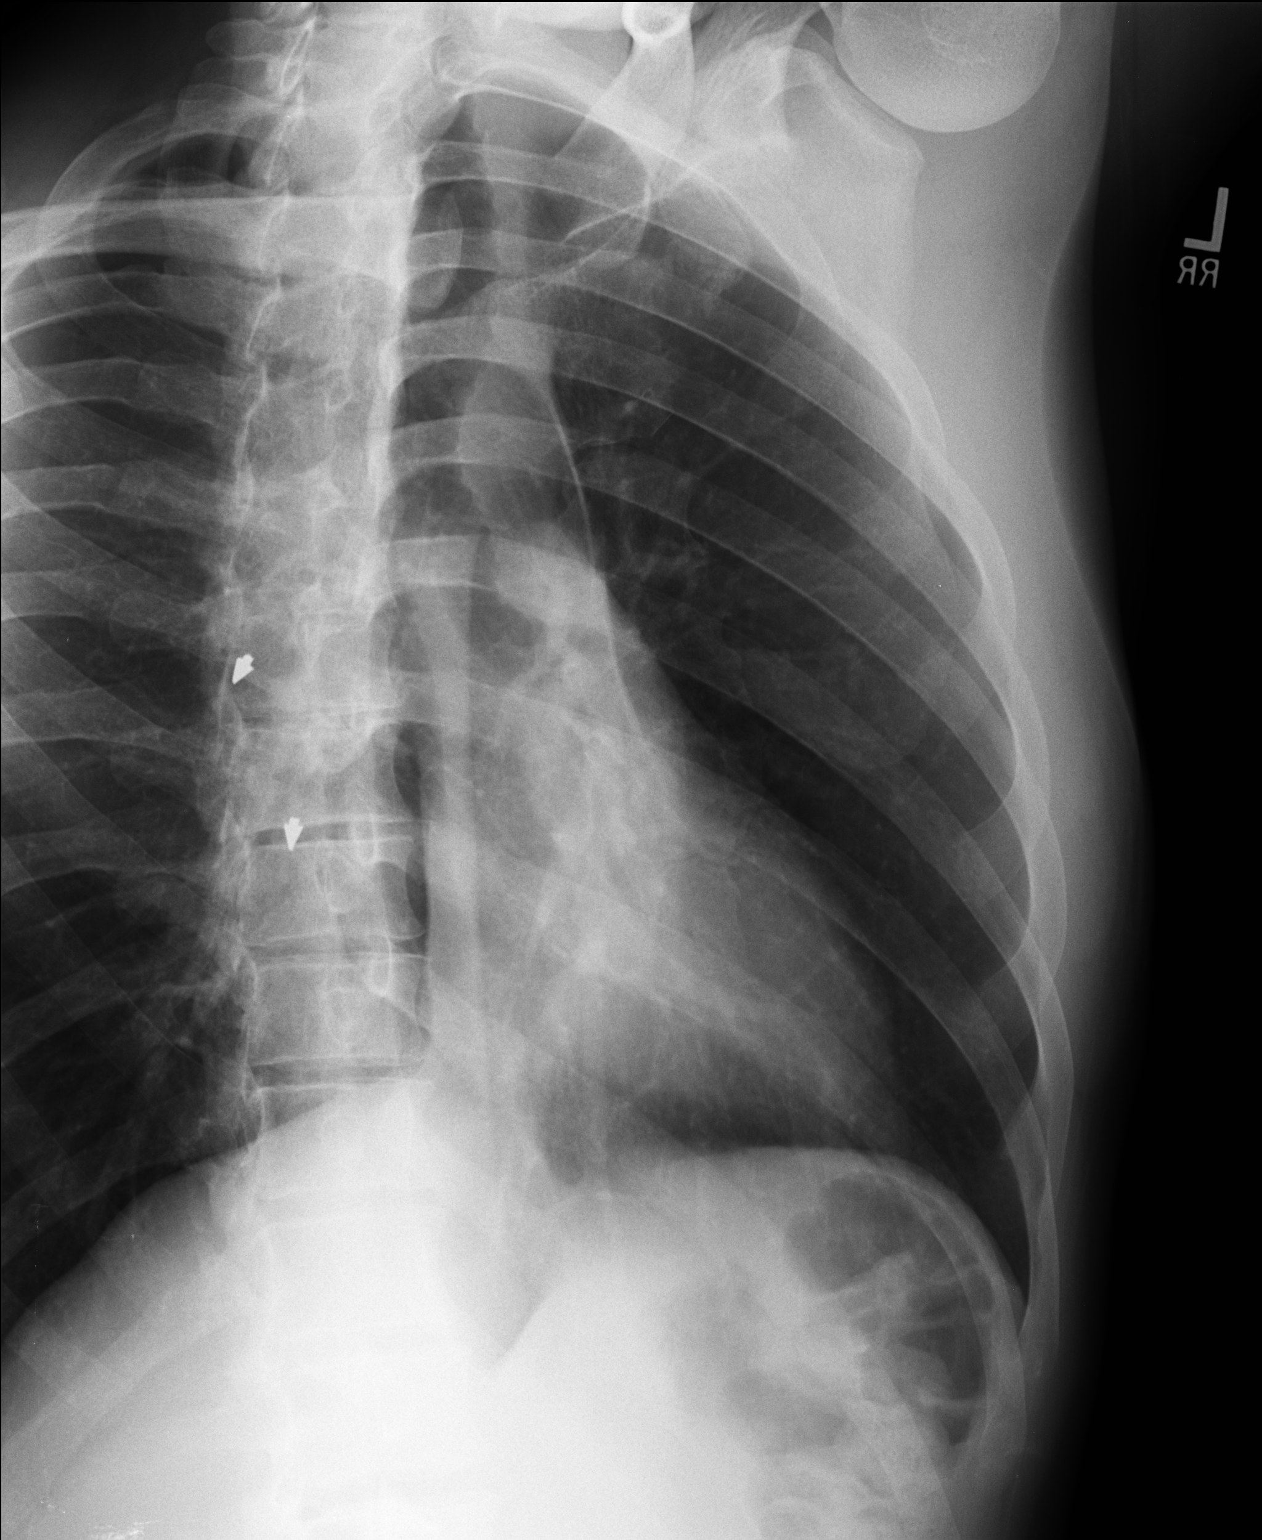

[lao]
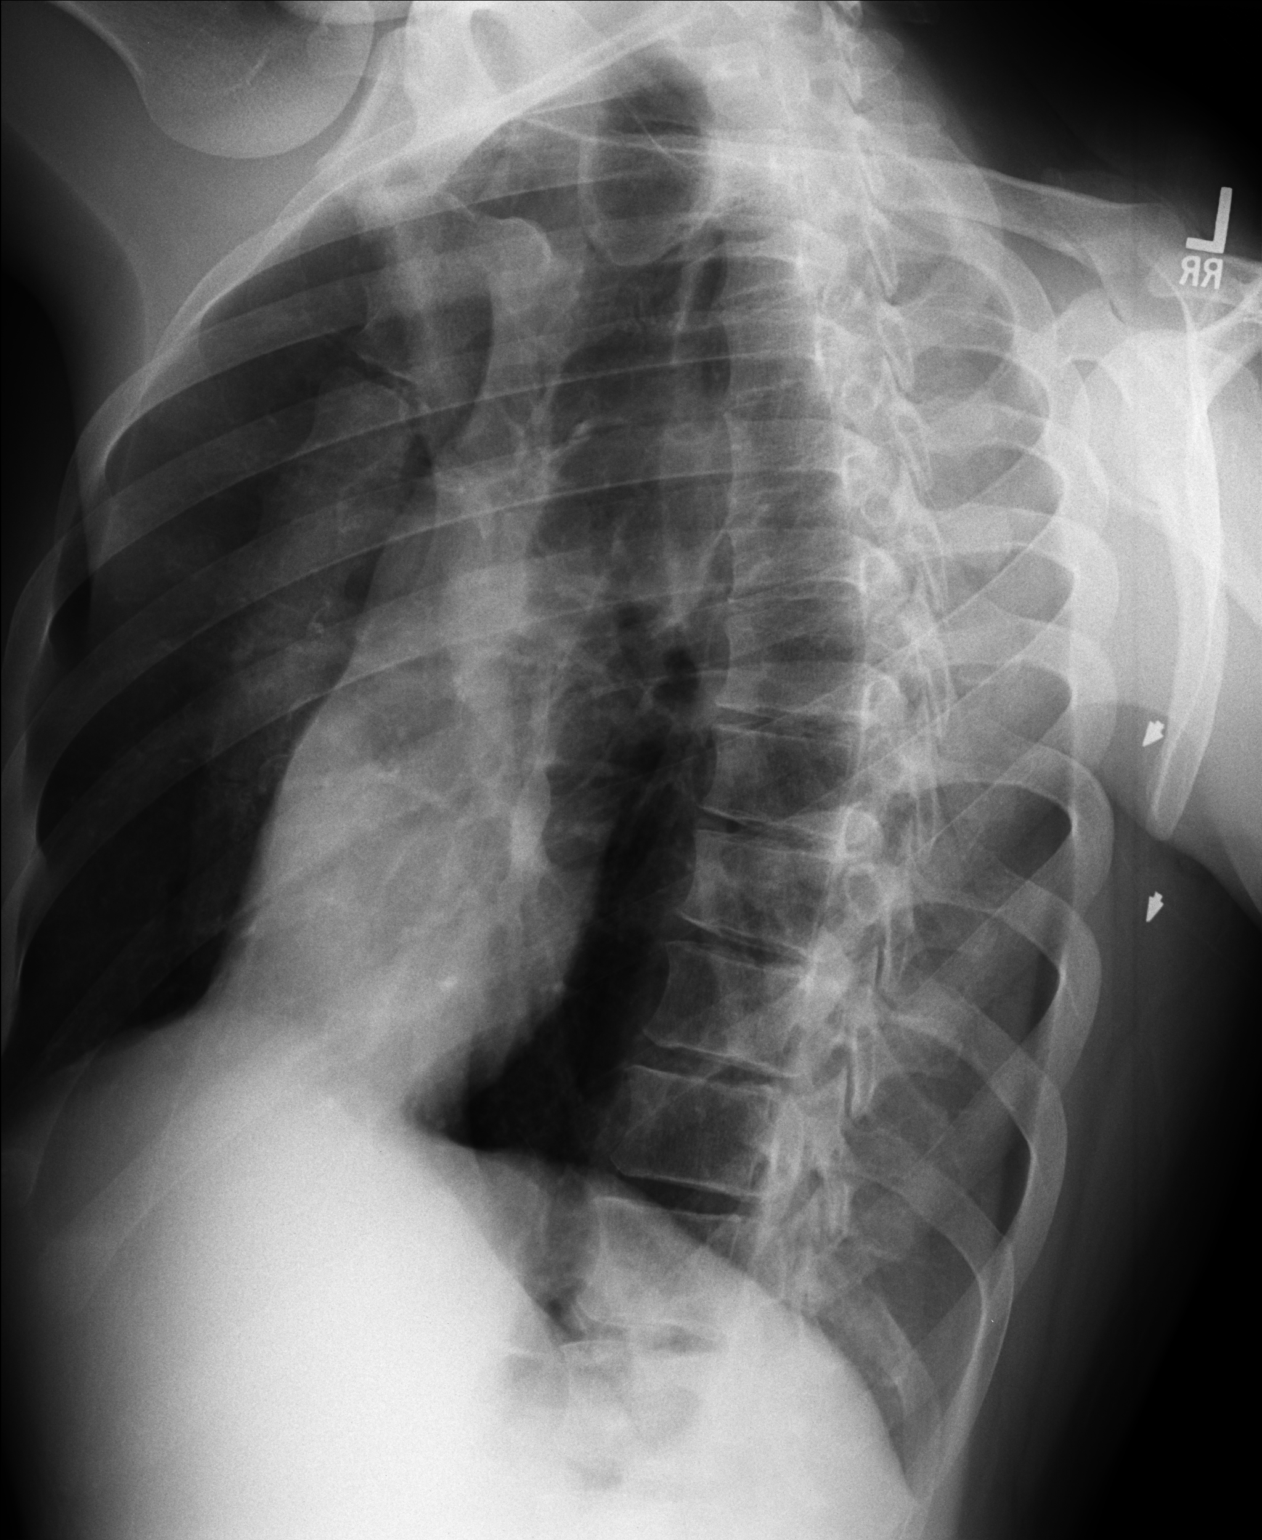

[3 of 3 positions shown; findings below may reference images not displayed]

FINDINGS: No fracture or other bone lesions are seen involving the ribs. There
is no evidence of pneumothorax or pleural effusion. Both lungs are
clear. Heart size and mediastinal contours are within normal limits.
IMPRESSION: Negative.
